# Patient Record
Sex: Male | Born: 2000 | Race: Black or African American | Hispanic: No | Marital: Single
Health system: Southern US, Community
[De-identification: ages and names within clinical notes are randomized; demographics above are authoritative.]

---
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
## 2023-07-17 ENCOUNTER — Emergency Department (HOSPITAL_COMMUNITY)
Admission: EM | Admit: 2023-07-17 | Discharge: 2023-07-18 | Disposition: A | Discharge status: Home / Self Care | Attending: Emergency Medicine | Admitting: Emergency Medicine

## 2023-07-17 ENCOUNTER — Emergency Department (HOSPITAL_COMMUNITY)

## 2023-07-17 ENCOUNTER — Other Ambulatory Visit: Payer: Self-pay

## 2023-07-17 DIAGNOSIS — W3400XA Accidental discharge from unspecified firearms or gun, initial encounter: Secondary | ICD-10-CM | POA: Diagnosis not present

## 2023-07-17 DIAGNOSIS — Z23 Encounter for immunization: Secondary | ICD-10-CM | POA: Insufficient documentation

## 2023-07-17 DIAGNOSIS — S81802A Unspecified open wound, left lower leg, initial encounter: Secondary | ICD-10-CM | POA: Insufficient documentation

## 2023-07-17 LAB — I-STAT CHEM 8, ED
BUN: 19 mg/dL (ref 6–20)
Calcium, Ion: 1.05 mmol/L — ABNORMAL LOW (ref 1.15–1.40)
Chloride: 106 mmol/L (ref 98–111)
Creatinine, Ser: 1.3 mg/dL — ABNORMAL HIGH (ref 0.61–1.24)
Glucose, Bld: 140 mg/dL — ABNORMAL HIGH (ref 70–99)
HCT: 45 % (ref 39.0–52.0)
Hemoglobin: 15.3 g/dL (ref 13.0–17.0)
Potassium: 3.6 mmol/L (ref 3.5–5.1)
Sodium: 140 mmol/L (ref 135–145)
TCO2: 21 mmol/L — ABNORMAL LOW (ref 22–32)

## 2023-07-17 LAB — I-STAT CG4 LACTIC ACID, ED: Lactic Acid, Venous: 4 mmol/L (ref 0.5–1.9)

## 2023-07-17 LAB — SAMPLE TO BLOOD BANK

## 2023-07-17 MED ORDER — CEFAZOLIN SODIUM-DEXTROSE 1-4 GM/50ML-% IV SOLN
1.0000 g | Freq: Once | INTRAVENOUS | Status: AC
  Administered 2023-07-18: 1 g via INTRAVENOUS
  Filled 2023-07-17: qty 50

## 2023-07-17 MED ORDER — MORPHINE SULFATE (PF) 4 MG/ML IV SOLN
INTRAVENOUS | Status: AC
  Administered 2023-07-17: 4 mg
  Filled 2023-07-17: qty 1

## 2023-07-17 MED ORDER — TETANUS-DIPHTH-ACELL PERTUSSIS 5-2.5-18.5 LF-MCG/0.5 IM SUSY
0.5000 mL | PREFILLED_SYRINGE | Freq: Once | INTRAMUSCULAR | Status: AC
  Administered 2023-07-18: 0.5 mL via INTRAMUSCULAR
  Filled 2023-07-17: qty 0.5

## 2023-07-17 MED ORDER — ONDANSETRON HCL 4 MG/2ML IJ SOLN
INTRAMUSCULAR | Status: AC
  Administered 2023-07-17: 4 mg
  Filled 2023-07-17: qty 2

## 2023-07-17 NOTE — ED Notes (Signed)
 PER Pt ONLY FAMILY WITH THE LAST NAME [place] OR [place] ARE ALLOWED BACK TO VISIT
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
## 2023-07-18 LAB — CBC
HCT: 43.7 % (ref 39.0–52.0)
Hemoglobin: 14.4 g/dL (ref 13.0–17.0)
MCH: 29.8 pg (ref 26.0–34.0)
MCHC: 33 g/dL (ref 30.0–36.0)
MCV: 90.3 fL (ref 80.0–100.0)
Platelets: 205 10*3/uL (ref 150–400)
RBC: 4.84 MIL/uL (ref 4.22–5.81)
RDW: 14.3 % (ref 11.5–15.5)
WBC: 10.1 10*3/uL (ref 4.0–10.5)
nRBC: 0 % (ref 0.0–0.2)

## 2023-07-18 LAB — COMPREHENSIVE METABOLIC PANEL WITH GFR
ALT: 17 U/L (ref 0–44)
AST: 24 U/L (ref 15–41)
Albumin: 4.5 g/dL (ref 3.5–5.0)
Alkaline Phosphatase: 68 U/L (ref 38–126)
Anion gap: 13 (ref 5–15)
BUN: 17 mg/dL (ref 6–20)
CO2: 22 mmol/L (ref 22–32)
Calcium: 9.4 mg/dL (ref 8.9–10.3)
Chloride: 104 mmol/L (ref 98–111)
Creatinine, Ser: 1.18 mg/dL (ref 0.61–1.24)
GFR, Estimated: >60 mL/min (ref >60)
Glucose, Bld: 124 mg/dL — ABNORMAL HIGH (ref 70–99)
Potassium: 3.3 mmol/L — ABNORMAL LOW (ref 3.5–5.1)
Sodium: 139 mmol/L (ref 135–145)
Total Bilirubin: 2.2 mg/dL — ABNORMAL HIGH (ref 0.0–1.2)
Total Protein: 8.2 g/dL — ABNORMAL HIGH (ref 6.5–8.1)

## 2023-07-18 LAB — ETHANOL: Alcohol, Ethyl (B): <15 mg/dL (ref <15)

## 2023-07-18 MED ORDER — OXYCODONE-ACETAMINOPHEN 5-325 MG PO TABS: 1.0000 | ORAL_TABLET | Freq: Four times a day (QID) | ORAL | 0 refills | Status: AC | PRN

## 2023-07-18 MED ORDER — BACITRACIN ZINC 500 UNIT/GM EX OINT
TOPICAL_OINTMENT | CUTANEOUS | Status: AC
  Administered 2023-07-18: 1 via TOPICAL
  Filled 2023-07-18: qty 1.8

## 2023-07-18 MED ORDER — SODIUM CHLORIDE 0.9 % IV BOLUS
1000.0000 mL | Freq: Once | INTRAVENOUS | Status: AC
  Administered 2023-07-18: 1000 mL via INTRAVENOUS

## 2023-07-18 MED ORDER — MORPHINE SULFATE (PF) 4 MG/ML IV SOLN
4.0000 mg | Freq: Once | INTRAVENOUS | Status: AC
  Administered 2023-07-18: 4 mg via INTRAVENOUS
  Filled 2023-07-18: qty 1

## 2023-07-18 MED ORDER — CEPHALEXIN 500 MG PO CAPS: 500.0000 mg | ORAL_CAPSULE | Freq: Four times a day (QID) | ORAL | 0 refills | Status: AC

## 2023-07-18 MED ORDER — BACITRACIN ZINC 500 UNIT/GM EX OINT: TOPICAL_OINTMENT | Freq: Once | CUTANEOUS | Status: AC
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
## 2023-07-18 NOTE — ED Notes (Signed)
 This RN assisted C. Horton, MD with wound care.
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
